# Patient Record
Sex: Female | Born: 1995 | Race: White | Hispanic: No | Marital: Single | Smoking: Never smoker
Health system: Southern US, Community
[De-identification: ages and names within clinical notes are randomized; demographics above are authoritative.]

---

## 2017-07-02 ENCOUNTER — Encounter: Payer: Self-pay | Admitting: Emergency Medicine

## 2017-07-02 ENCOUNTER — Other Ambulatory Visit: Payer: Self-pay

## 2017-07-02 ENCOUNTER — Emergency Department: Payer: Commercial Managed Care - HMO

## 2017-07-02 DIAGNOSIS — Y33XXXA Other specified events, undetermined intent, initial encounter: Secondary | ICD-10-CM | POA: Diagnosis not present

## 2017-07-02 DIAGNOSIS — Y929 Unspecified place or not applicable: Secondary | ICD-10-CM | POA: Diagnosis not present

## 2017-07-02 DIAGNOSIS — S8011XA Contusion of right lower leg, initial encounter: Secondary | ICD-10-CM | POA: Insufficient documentation

## 2017-07-02 DIAGNOSIS — Y998 Other external cause status: Secondary | ICD-10-CM | POA: Diagnosis not present

## 2017-07-02 DIAGNOSIS — Y939 Activity, unspecified: Secondary | ICD-10-CM | POA: Insufficient documentation

## 2017-07-02 NOTE — ED Triage Notes (Signed)
Patient ambulatory to triage with steady gait, without difficulty or distress noted; pt reports swelling/bruise to leg with unknown cause noted this evening; purplish bruising noted to back of right calf just below popliteal area, tender to touch with some swelling; pt reports pain behind knee

## 2017-07-03 ENCOUNTER — Encounter: Payer: Self-pay | Admitting: Emergency Medicine

## 2017-07-03 ENCOUNTER — Emergency Department
Admission: EM | Admit: 2017-07-03 | Discharge: 2017-07-03 | Disposition: A | Discharge status: Home / Self Care | Payer: Commercial Managed Care - HMO | Attending: Emergency Medicine | Admitting: Emergency Medicine

## 2017-07-03 DIAGNOSIS — R58 Hemorrhage, not elsewhere classified: Secondary | ICD-10-CM

## 2017-07-03 DIAGNOSIS — M7989 Other specified soft tissue disorders: Secondary | ICD-10-CM

## 2017-07-03 LAB — BASIC METABOLIC PANEL
Anion gap: 9 (ref 5–15)
BUN: 11 mg/dL (ref 6–20)
CHLORIDE: 105 mmol/L (ref 101–111)
CO2: 24 mmol/L (ref 22–32)
CREATININE: 0.72 mg/dL (ref 0.44–1.00)
Calcium: 9.5 mg/dL (ref 8.9–10.3)
GFR calc Af Amer: >60 mL/min (ref >60)
GFR calc non Af Amer: >60 mL/min (ref >60)
Glucose, Bld: 120 mg/dL — ABNORMAL HIGH (ref 65–99)
POTASSIUM: 3.6 mmol/L (ref 3.5–5.1)
SODIUM: 138 mmol/L (ref 135–145)

## 2017-07-03 LAB — CBC
HEMATOCRIT: 42 % (ref 35.0–47.0)
HEMOGLOBIN: 14.3 g/dL (ref 12.0–16.0)
MCH: 28.7 pg (ref 26.0–34.0)
MCHC: 34 g/dL (ref 32.0–36.0)
MCV: 84.5 fL (ref 80.0–100.0)
Platelets: 266 10*3/uL (ref 150–440)
RBC: 4.97 MIL/uL (ref 3.80–5.20)
RDW: 12.7 % (ref 11.5–14.5)
WBC: 9.6 10*3/uL (ref 3.6–11.0)

## 2017-07-03 NOTE — ED Provider Notes (Signed)
[facility] Emergency Department Provider Note   ____________________________________________   First MD Initiated Contact with Patient 07/03/17 0102     (approximate)  I have reviewed the triage vital signs and the nursing notes.   HISTORY  Chief Complaint Leg Swelling    HPI Kim Salas is a 22 y.o. female who comes into the hospital today with a bruise to the back of her right leg.  The patient states that she does not remember hitting anything or injuring herself in any way.  It was not there this morning or this afternoon.  The patient is unsure why it came up so quickly.  She is never had it before.  The patient denies any other bruising or any other injury.  She is also having some swelling behind her right knee which was concerning.  She reports that the bruise itself is not painful but the swelling is uncomfortable.  The patient rates her pain a 6 out of 10 in intensity.  She is here today for evaluation of this bruising.   History reviewed. No pertinent past medical history.  There are no active problems to display for this patient.   History reviewed. No pertinent surgical history.  Prior to Admission medications   Not on File    Allergies Patient has no known allergies.  No family history on file.  Social History Social History   Tobacco Use  . Smoking status: Never Smoker  . Smokeless tobacco: Never Used  Substance Use Topics  . Alcohol use: Yes  . Drug use: No    Review of Systems  Constitutional: No fever/chills Eyes: No visual changes. ENT: No sore throat. Cardiovascular: Denies chest pain. Respiratory: Denies shortness of breath. Gastrointestinal: No abdominal pain.  No nausea, no vomiting.  No diarrhea.  No constipation. Genitourinary: Negative for dysuria. Musculoskeletal: Negative for back pain. Skin: Bruise to right leg Neurological: Negative for headaches, focal weakness or  numbness.   ____________________________________________   PHYSICAL EXAM:  VITAL SIGNS: ED Triage Vitals  Enc Vitals Group     BP 07/02/17 2311 139/76     Pulse Rate 07/02/17 2311 73     Resp 07/02/17 2311 18     Temp 07/02/17 2311 97.7 F (36.5 C)     Temp Source 07/02/17 2311 Oral     SpO2 07/02/17 2311 97 %     Weight 07/02/17 2254 135 lb (61.2 kg)     Height 07/02/17 2254 5\' 4"  (1.626 m)     Head Circumference --      Peak Flow --      Pain Score 07/02/17 2253 5     Pain Loc --      Pain Edu? --      Excl. in GC? --     Constitutional: Alert and oriented. Well appearing and in no acute distress. Eyes: Conjunctivae are normal. PERRL. EOMI. Head: Atraumatic. Nose: No congestion/rhinnorhea. Mouth/Throat: Mucous membranes are moist.  Oropharynx non-erythematous. Cardiovascular: Normal rate, regular rhythm. Grossly normal heart sounds.  Good peripheral circulation. Respiratory: Normal respiratory effort.  No retractions. Lungs CTAB. Gastrointestinal: Soft and nontender. No distention.  Positive bowel sounds Musculoskeletal: No lower extremity tenderness nor edema.   Neurologic:  Normal speech and language.  Skin:  Skin is warm, dry and intact.  Bruising noted to patient's right leg behind the knee minimal tenderness to palpation minimal swelling no redness noted. Psychiatric: Mood and affect are normal.  ____________________________________________   LABS (all labs ordered  are listed, but only abnormal results are displayed)  Labs Reviewed  BASIC METABOLIC PANEL - Abnormal; Notable for the following components:      Result Value   Glucose, Bld 120 (*)    All other components within normal limits  CBC   ____________________________________________  EKG  none ____________________________________________  RADIOLOGY  ED MD interpretation:  US venous Lower leg right: No evidence of right lower extremity DVT  Official radiology report(s): US Venous Img Lower  Unilateral Right  Result Date: 07/03/2017 CLINICAL DATA:  Right lower extremity pain. EXAM: RIGHT LOWER EXTREMITY VENOUS DOPPLER ULTRASOUND TECHNIQUE: Gray-scale sonography with graded compression, as well as color Doppler and duplex ultrasound were performed to evaluate the lower extremity deep venous systems from the level of the common femoral vein and including the common femoral, femoral, profunda femoral, popliteal and calf veins including the posterior tibial, peroneal and gastrocnemius veins when visible. The superficial great saphenous vein was also interrogated. Spectral Doppler was utilized to evaluate flow at rest and with distal augmentation maneuvers in the common femoral, femoral and popliteal veins. COMPARISON:  None. FINDINGS: Contralateral Common Femoral Vein: Respiratory phasicity is normal and symmetric with the symptomatic side. No evidence of thrombus. Normal compressibility. Common Femoral Vein: No evidence of thrombus. Normal compressibility, respiratory phasicity and response to augmentation. Saphenofemoral Junction: No evidence of thrombus. Normal compressibility and flow on color Doppler imaging. Profunda Femoral Vein: No evidence of thrombus. Normal compressibility and flow on color Doppler imaging. Femoral Vein: No evidence of thrombus. Normal compressibility, respiratory phasicity and response to augmentation. Popliteal Vein: No evidence of thrombus. Normal compressibility, respiratory phasicity and response to augmentation. Calf Veins: No evidence of thrombus. Normal compressibility and flow on color Doppler imaging. Superficial Great Saphenous Vein: No evidence of thrombus. Normal compressibility. Venous Reflux:  None. Other Findings: No focal sonographic abnormality in the area of bruising in the right upper calf. IMPRESSION: No evidence of right lower extremity deep venous thrombosis. Electronically Signed   By: Rubye Oaks M.D.   On: 07/03/2017 00:19     ____________________________________________   PROCEDURES  Procedure(s) performed: None  Procedures  Critical Care performed: No  ____________________________________________   INITIAL IMPRESSION / ASSESSMENT AND PLAN / ED COURSE  As part of my medical decision making, I reviewed the following data within the electronic MEDICAL RECORD NUMBER Notes from prior ED visits and [place] Controlled Substance Database   This is a 22 year old female who comes into the hospital today with a bruise to her right lower extremity as well as some swelling.  My differential diagnosis includes DVT, ITP, traumatic ecchymosis.  The patient did have an ultrasound of her right lower extremity looking for DVT which was negative.  I will check a CBC and a BMP with a concern for possible ITP causing the spontaneous bruising.  I will reassess the patient once I receive her results.     The patient's blood work returned and was unremarkable.  The patient's platelets are 266 her hemoglobin is 14.3 and her hematocrit is 42.0.  She will be discharged and encouraged to follow-up with the acute care clinic or with student health. ____________________________________________   FINAL CLINICAL IMPRESSION(S) / ED DIAGNOSES  Final diagnoses:  Ecchymosis  Leg swelling     ED Discharge Orders    None       Note:  This document was prepared using Dragon voice recognition software and may include unintentional dictation errors.    Rebecka Apley, MD 07/03/17 [phone]

## 2017-07-03 NOTE — Discharge Instructions (Signed)
Please continue to monitor the area. Place ice to the area, rest and elevate it. Please follow up with [facility] or with the acute care clinic.

## 2018-09-13 IMAGING — US US EXTREM LOW VENOUS*R*
1 series · 13 of 24 positions shown · non-contrast
Comparison: None.

CLINICAL DATA: Right lower extremity pain.



[Series 1: us extrem low venous*right* · 0.05mm/px · 13 of 34 slices shown]
[im 1/34]
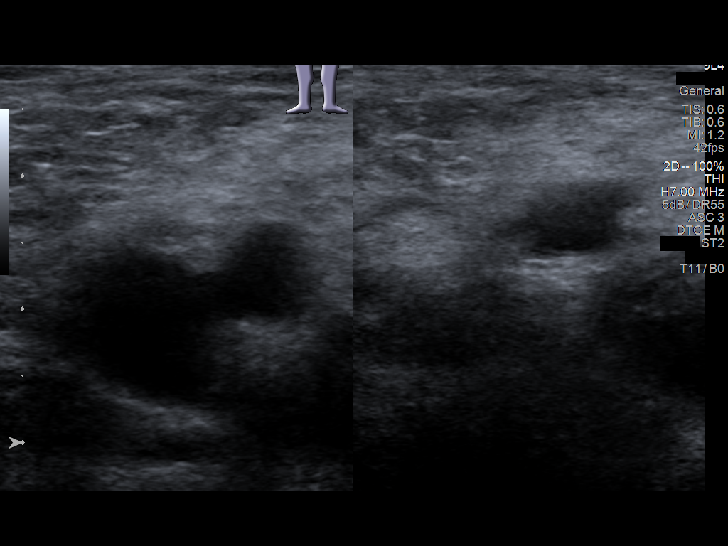
[im 3/34]
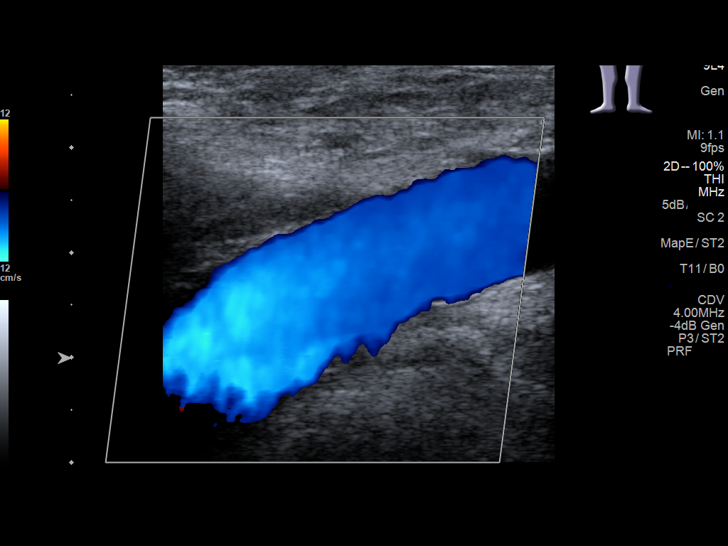
[im 6/34]
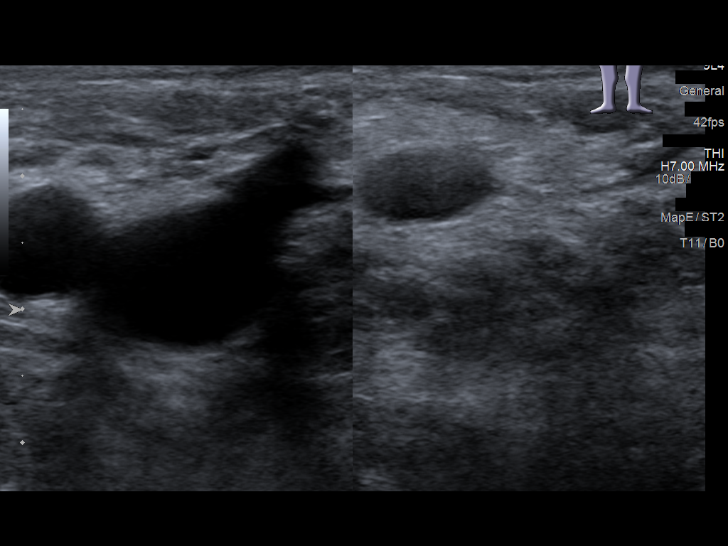
[im 9/34]
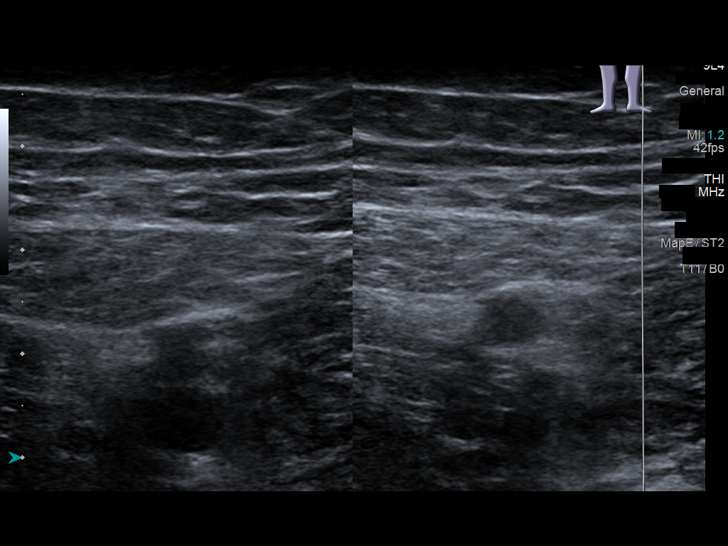
[im 12/34]
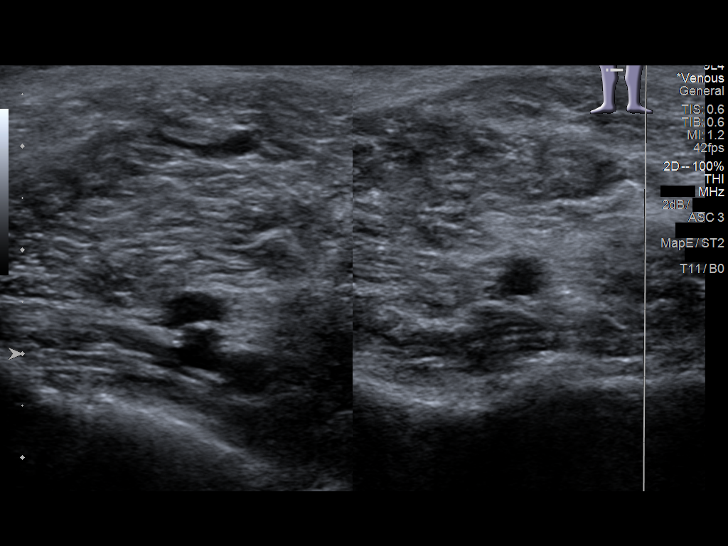
[im 15/34]
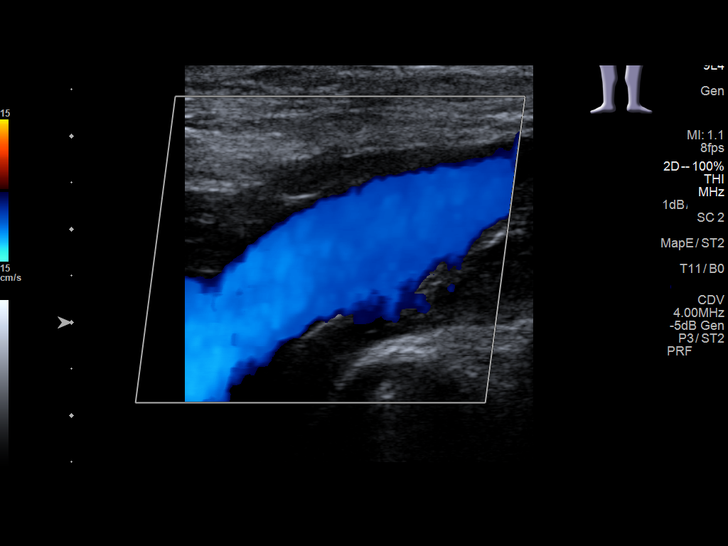
[im 18/34]
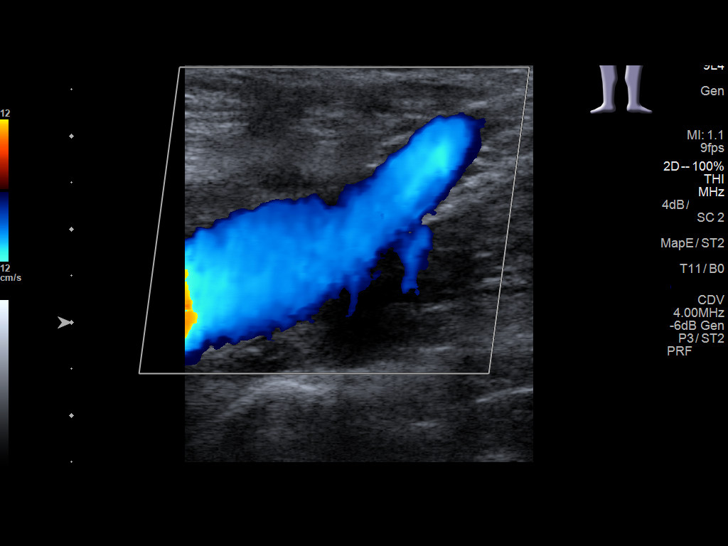
[im 19/34]
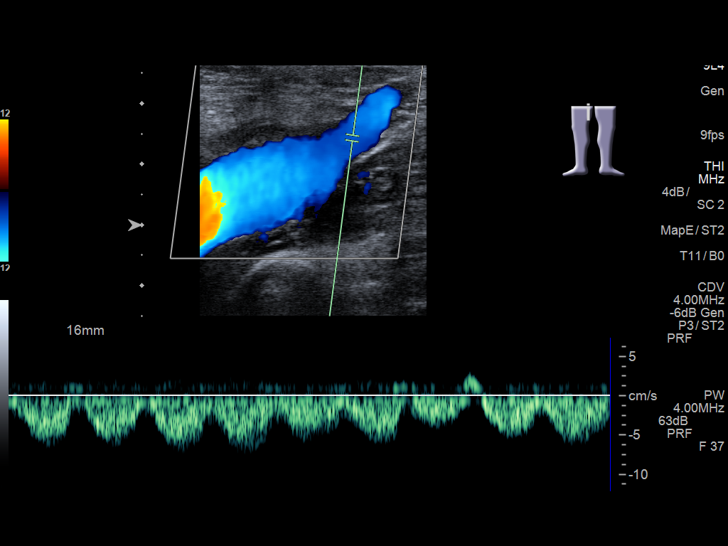
[im 22/34]
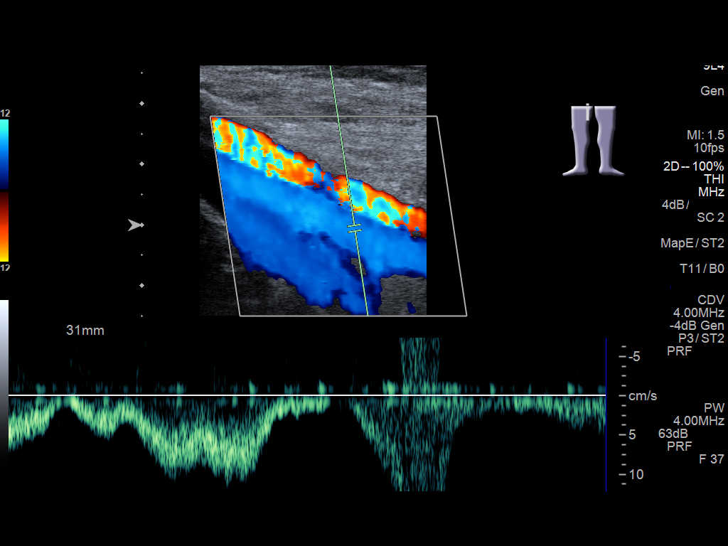
[im 25/34]
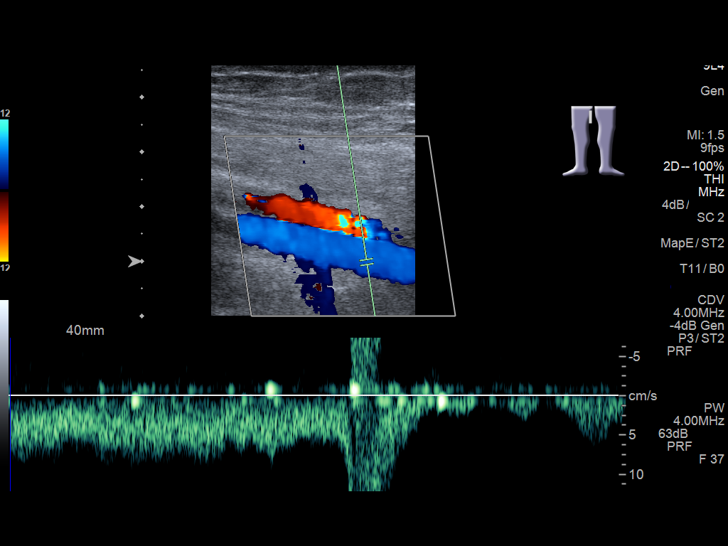
[im 28/34]
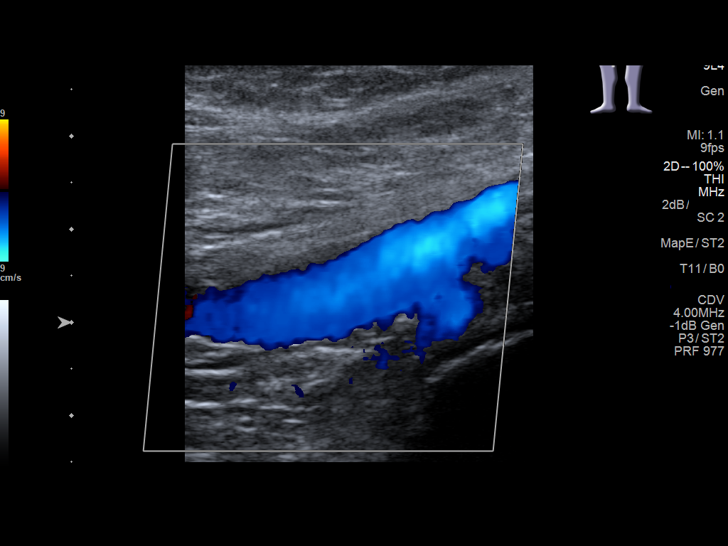
[im 31/34]
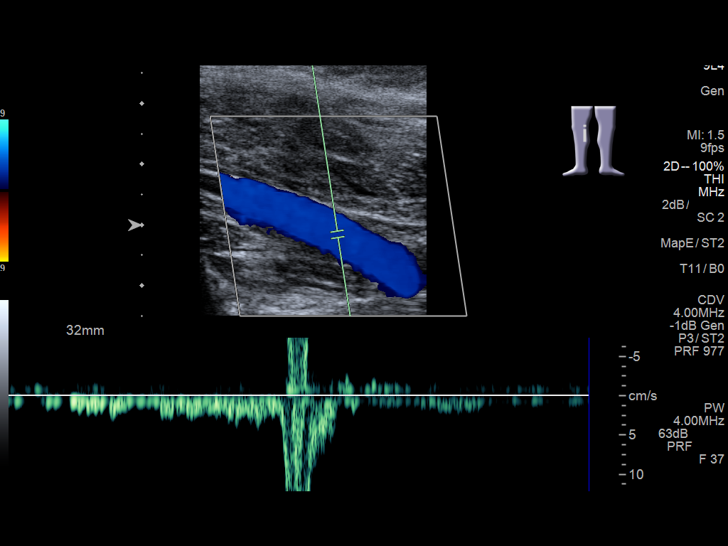
[im 34/34]
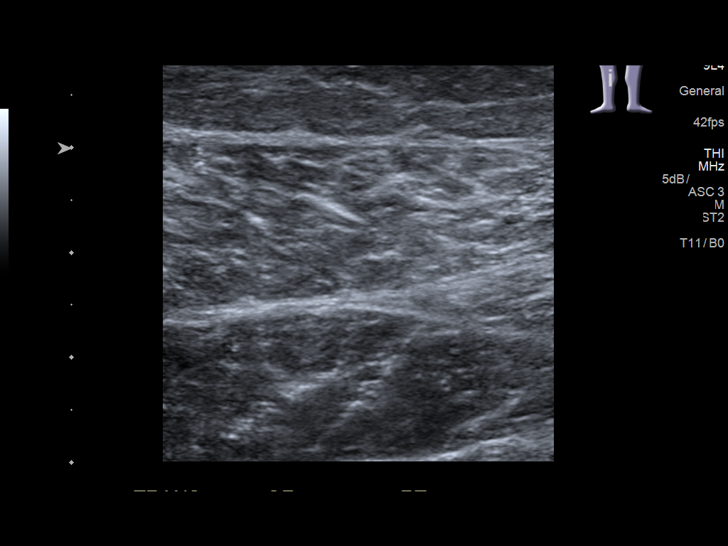

[13 of 24 positions shown; findings below may reference images not displayed]

FINDINGS: Contralateral Common Femoral Vein: Respiratory phasicity is normal
and symmetric with the symptomatic side. No evidence of thrombus.
Normal compressibility.

Common Femoral Vein: No evidence of thrombus. Normal
compressibility, respiratory phasicity and response to augmentation.

Saphenofemoral Junction: No evidence of thrombus. Normal
compressibility and flow on color Doppler imaging.

Profunda Femoral Vein: No evidence of thrombus. Normal
compressibility and flow on color Doppler imaging.

Femoral Vein: No evidence of thrombus. Normal compressibility,
respiratory phasicity and response to augmentation.

Popliteal Vein: No evidence of thrombus. Normal compressibility,
respiratory phasicity and response to augmentation.

Calf Veins: No evidence of thrombus. Normal compressibility and flow
on color Doppler imaging.

Superficial Great Saphenous Vein: No evidence of thrombus. Normal
compressibility.

Venous Reflux:  None.

Other Findings: No focal sonographic abnormality in the area of
bruising in the right upper calf.
IMPRESSION: No evidence of right lower extremity deep venous thrombosis.
